# Patient Record
Sex: Female | Born: 1955 | Race: White | Hispanic: No | Marital: Single | Smoking: Never smoker
Health system: Southern US, Community
[De-identification: ages and names within clinical notes are randomized; demographics above are authoritative.]

## PROBLEM LIST (undated history)

## (undated) DIAGNOSIS — W891XXA Exposure to tanning bed, initial encounter: Secondary | ICD-10-CM

## (undated) DIAGNOSIS — I10 Essential (primary) hypertension: Secondary | ICD-10-CM

## (undated) HISTORY — PX: ABDOMINAL HYSTERECTOMY: SHX81

## (undated) HISTORY — DX: Exposure to tanning bed, initial encounter: W89.1XXA

---

## 2005-03-31 ENCOUNTER — Ambulatory Visit: Payer: Self-pay | Admitting: Obstetrics and Gynecology

## 2006-04-06 ENCOUNTER — Ambulatory Visit: Payer: Self-pay | Admitting: Obstetrics and Gynecology

## 2007-04-10 ENCOUNTER — Ambulatory Visit: Payer: Self-pay | Admitting: Obstetrics and Gynecology

## 2008-04-23 ENCOUNTER — Ambulatory Visit: Payer: Self-pay | Admitting: Obstetrics and Gynecology

## 2009-05-07 ENCOUNTER — Ambulatory Visit: Payer: Self-pay | Admitting: Obstetrics and Gynecology

## 2010-05-13 ENCOUNTER — Ambulatory Visit: Payer: Self-pay | Admitting: Obstetrics and Gynecology

## 2011-05-19 ENCOUNTER — Ambulatory Visit: Payer: Self-pay | Admitting: Obstetrics and Gynecology

## 2012-06-14 ENCOUNTER — Ambulatory Visit: Payer: Self-pay | Admitting: Obstetrics and Gynecology

## 2014-06-06 ENCOUNTER — Ambulatory Visit: Payer: Self-pay | Admitting: Unknown Physician Specialty

## 2014-08-14 ENCOUNTER — Other Ambulatory Visit: Payer: Self-pay | Admitting: Obstetrics and Gynecology

## 2014-08-14 DIAGNOSIS — Z1231 Encounter for screening mammogram for malignant neoplasm of breast: Secondary | ICD-10-CM

## 2014-08-28 ENCOUNTER — Ambulatory Visit
Admission: RE | Admit: 2014-08-28 | Discharge: 2014-08-28 | Disposition: A | Discharge status: Home / Self Care | Payer: BC Managed Care – PPO | Source: Ambulatory Visit | Attending: Obstetrics and Gynecology | Admitting: Obstetrics and Gynecology

## 2014-08-28 DIAGNOSIS — Z1231 Encounter for screening mammogram for malignant neoplasm of breast: Secondary | ICD-10-CM | POA: Diagnosis not present

## 2015-03-20 ENCOUNTER — Other Ambulatory Visit: Payer: Self-pay | Admitting: Obstetrics and Gynecology

## 2015-03-20 DIAGNOSIS — R600 Localized edema: Secondary | ICD-10-CM

## 2015-03-20 DIAGNOSIS — Z1231 Encounter for screening mammogram for malignant neoplasm of breast: Secondary | ICD-10-CM

## 2015-04-21 ENCOUNTER — Ambulatory Visit: Payer: BC Managed Care – PPO

## 2015-04-22 ENCOUNTER — Ambulatory Visit
Admission: RE | Admit: 2015-04-22 | Discharge: 2015-04-22 | Disposition: A | Discharge status: Home / Self Care | Payer: BC Managed Care – PPO | Source: Ambulatory Visit | Attending: Obstetrics and Gynecology | Admitting: Obstetrics and Gynecology

## 2015-04-22 DIAGNOSIS — R600 Localized edema: Secondary | ICD-10-CM | POA: Diagnosis not present

## 2015-04-22 DIAGNOSIS — M79604 Pain in right leg: Secondary | ICD-10-CM | POA: Diagnosis present

## 2015-04-22 DIAGNOSIS — M79605 Pain in left leg: Secondary | ICD-10-CM | POA: Diagnosis present

## 2015-08-29 ENCOUNTER — Ambulatory Visit: Payer: BC Managed Care – PPO

## 2015-10-08 ENCOUNTER — Other Ambulatory Visit: Payer: Self-pay | Admitting: Obstetrics and Gynecology

## 2015-10-08 ENCOUNTER — Ambulatory Visit
Admission: RE | Admit: 2015-10-08 | Discharge: 2015-10-08 | Disposition: A | Discharge status: Home / Self Care | Payer: BC Managed Care – PPO | Source: Ambulatory Visit | Attending: Obstetrics and Gynecology | Admitting: Obstetrics and Gynecology

## 2015-10-08 DIAGNOSIS — Z1231 Encounter for screening mammogram for malignant neoplasm of breast: Secondary | ICD-10-CM

## 2016-09-26 IMAGING — US US EXTREM LOW VENOUS BILAT
1 series · 13 of 24 positions shown · non-contrast
Comparison: None.

CLINICAL DATA: Bilateral lower extremity edema.



[Series 1: us extrem low venous bilat · 0.08mm/px · 13 of 58 slices shown]
[im 1/58]
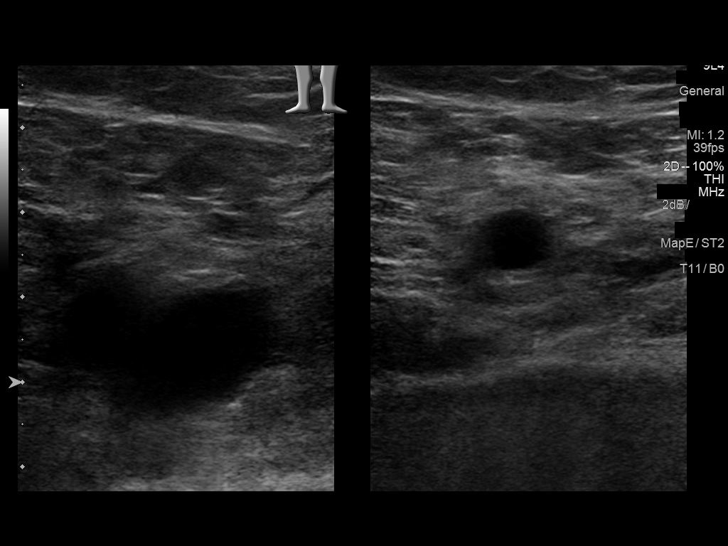
[im 5/58]
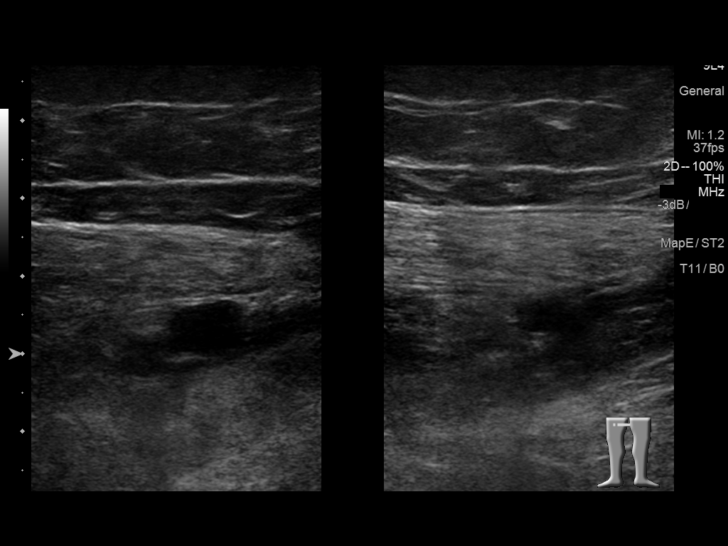
[im 10/58]
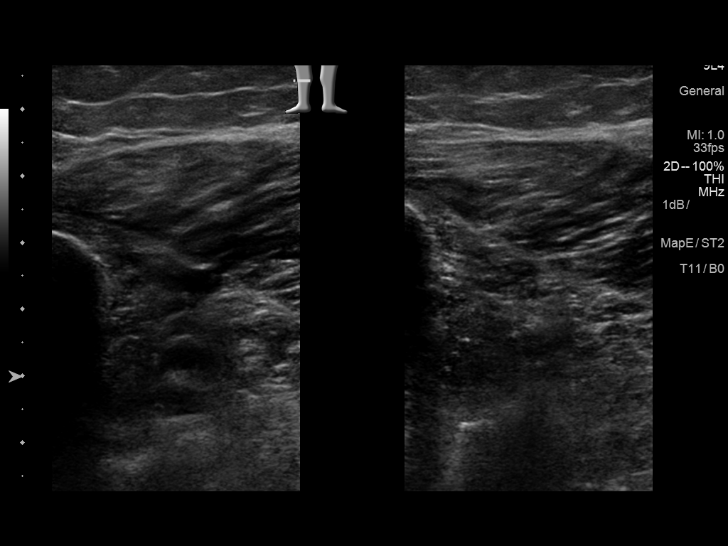
[im 15/58]
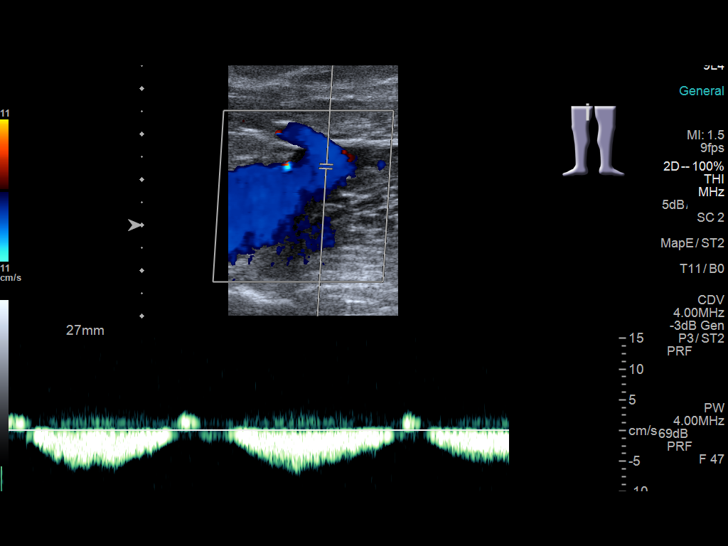
[im 20/58]
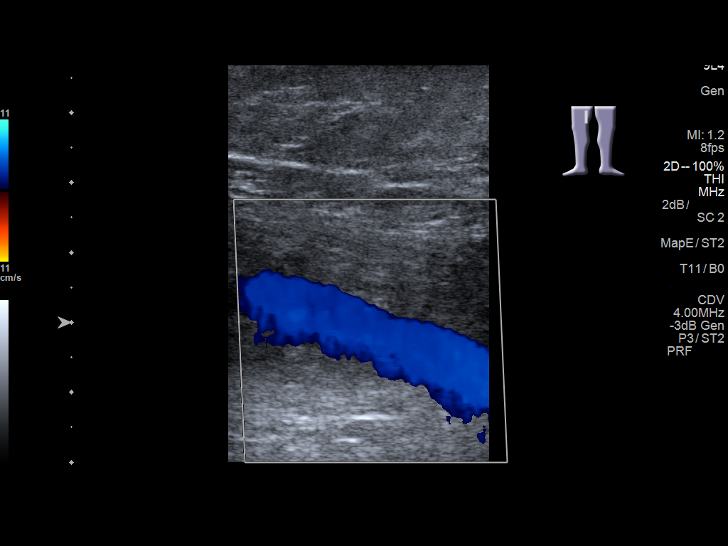
[im 25/58]
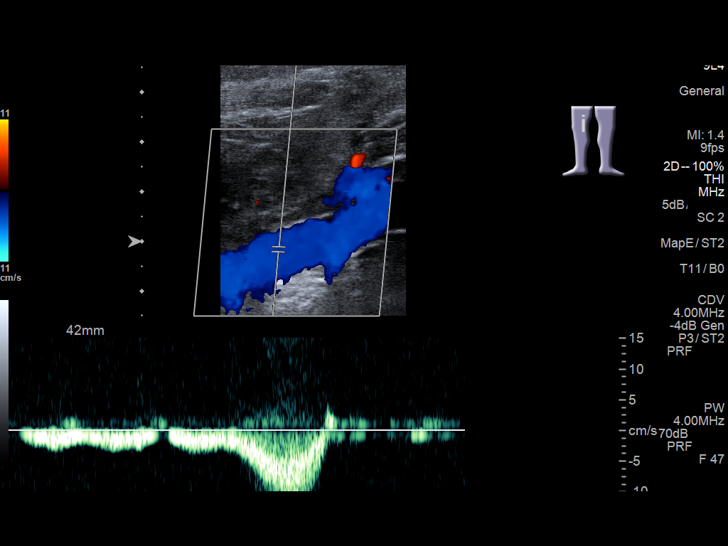
[im 30/58]
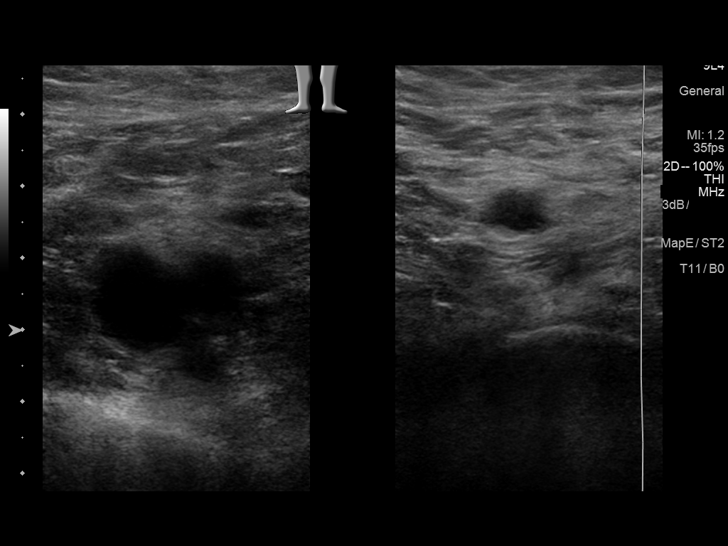
[im 33/58]
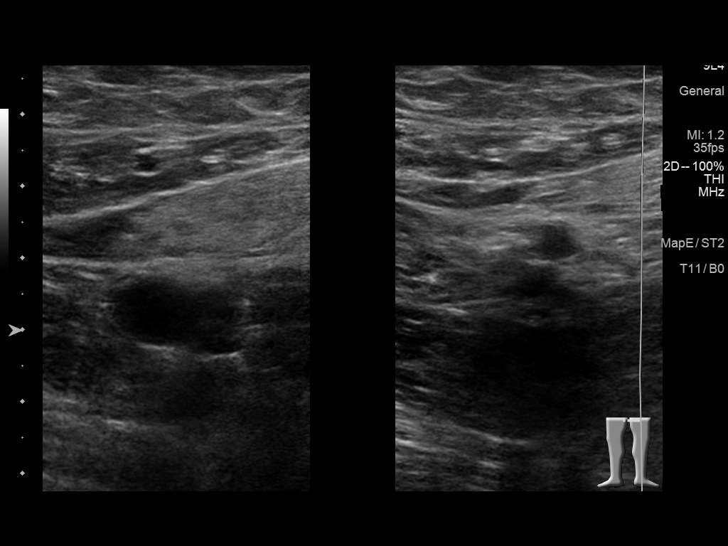
[im 38/58]
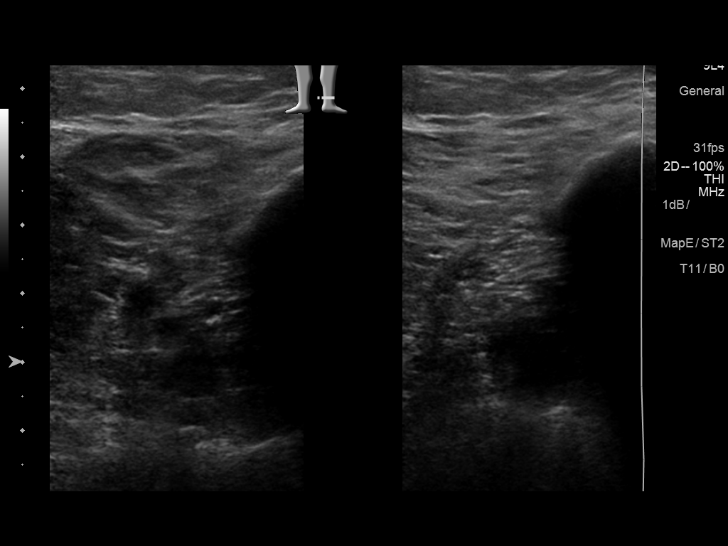
[im 43/58]
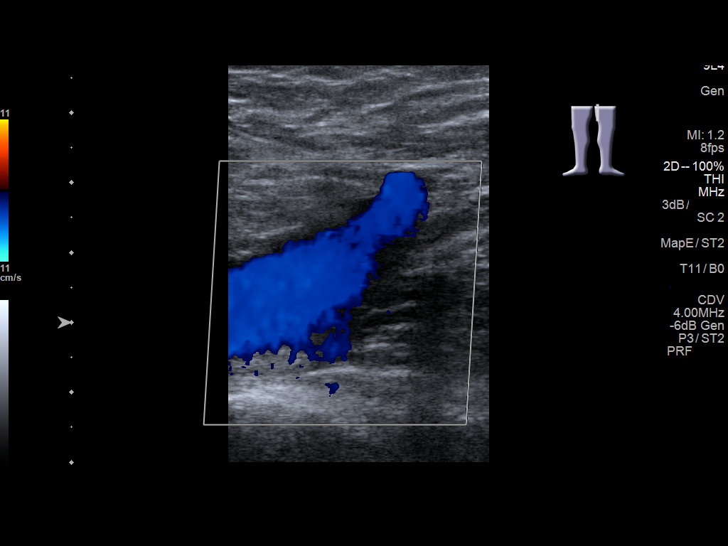
[im 48/58]
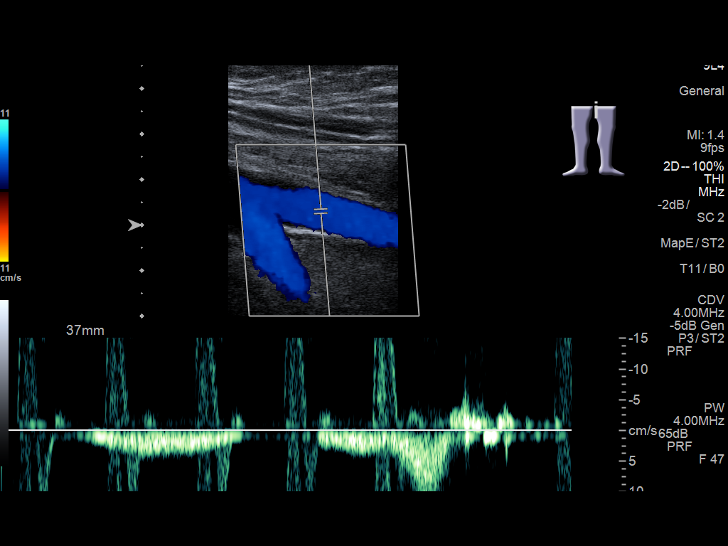
[im 53/58]
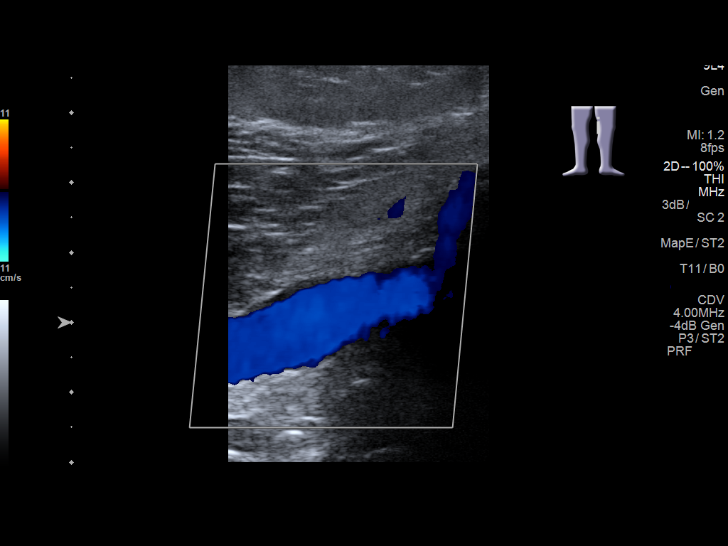
[im 58/58]
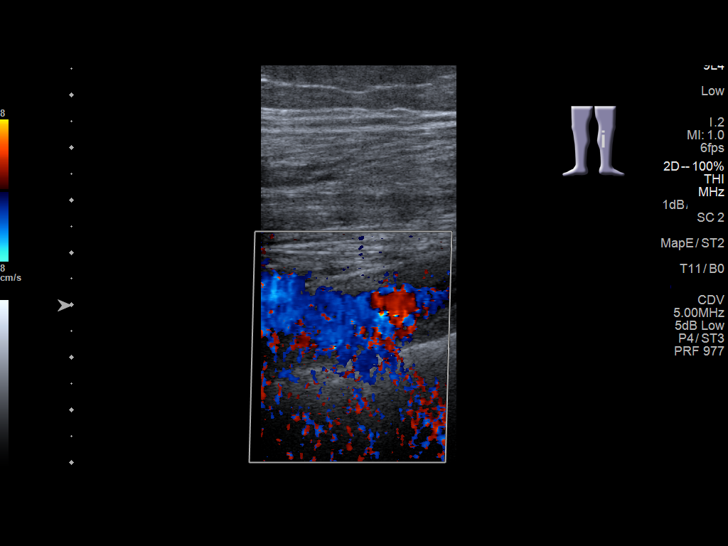

[13 of 24 positions shown; findings below may reference images not displayed]

FINDINGS: RIGHT LOWER EXTREMITY

Common Femoral Vein: No evidence of thrombus. Normal
compressibility, respiratory phasicity and response to augmentation.

Saphenofemoral Junction: No evidence of thrombus. Normal
compressibility and flow on color Doppler imaging.

Profunda Femoral Vein: No evidence of thrombus. Normal
compressibility and flow on color Doppler imaging.

Femoral Vein: No evidence of thrombus. Normal compressibility,
respiratory phasicity and response to augmentation.

Popliteal Vein: No evidence of thrombus. Normal compressibility,
respiratory phasicity and response to augmentation.

Calf Veins: No evidence of thrombus. Normal compressibility and flow
on color Doppler imaging.

Superficial Great Saphenous Vein: No evidence of thrombus. Normal
compressibility and flow on color Doppler imaging.

Other Findings:  None.

LEFT LOWER EXTREMITY

Common Femoral Vein: No evidence of thrombus. Normal
compressibility, respiratory phasicity and response to augmentation.

Saphenofemoral Junction: No evidence of thrombus. Normal
compressibility and flow on color Doppler imaging.

Profunda Femoral Vein: No evidence of thrombus. Normal
compressibility and flow on color Doppler imaging.

Femoral Vein: No evidence of thrombus. Normal compressibility,
respiratory phasicity and response to augmentation.

Popliteal Vein: No evidence of thrombus. Normal compressibility,
respiratory phasicity and response to augmentation.

Calf Veins: No evidence of thrombus. Normal compressibility and flow
on color Doppler imaging.

Superficial Great Saphenous Vein: No evidence of thrombus. Normal
compressibility and flow on color Doppler imaging.

Other Findings:  None.
IMPRESSION: No evidence of deep venous thrombosis.

## 2016-11-25 ENCOUNTER — Ambulatory Visit: Payer: Self-pay | Admitting: Family Medicine

## 2016-12-06 ENCOUNTER — Encounter: Payer: Self-pay | Admitting: Family Medicine

## 2016-12-06 ENCOUNTER — Ambulatory Visit (INDEPENDENT_AMBULATORY_CARE_PROVIDER_SITE_OTHER): Payer: BC Managed Care – PPO | Admitting: Family Medicine

## 2016-12-06 VITALS — BP 128/84 | HR 77 | Resp 16 | Ht 63.5 in | Wt 191.6 lb

## 2016-12-06 DIAGNOSIS — K5901 Slow transit constipation: Secondary | ICD-10-CM | POA: Diagnosis not present

## 2016-12-06 DIAGNOSIS — R5383 Other fatigue: Secondary | ICD-10-CM | POA: Diagnosis not present

## 2016-12-06 DIAGNOSIS — Z0000 Encounter for general adult medical examination without abnormal findings: Secondary | ICD-10-CM | POA: Diagnosis not present

## 2016-12-06 DIAGNOSIS — H6120 Impacted cerumen, unspecified ear: Secondary | ICD-10-CM | POA: Insufficient documentation

## 2016-12-06 DIAGNOSIS — Z23 Encounter for immunization: Secondary | ICD-10-CM

## 2016-12-06 DIAGNOSIS — H6122 Impacted cerumen, left ear: Secondary | ICD-10-CM

## 2016-12-06 DIAGNOSIS — E669 Obesity, unspecified: Secondary | ICD-10-CM | POA: Diagnosis not present

## 2016-12-06 DIAGNOSIS — G44229 Chronic tension-type headache, not intractable: Secondary | ICD-10-CM | POA: Diagnosis not present

## 2016-12-06 DIAGNOSIS — Z7189 Other specified counseling: Secondary | ICD-10-CM

## 2016-12-06 DIAGNOSIS — R609 Edema, unspecified: Secondary | ICD-10-CM | POA: Diagnosis not present

## 2016-12-06 NOTE — Progress Notes (Signed)
Date:  12/06/2016   Name:  Mikayla Carter   DOB:  1956/01/07   MRN:  [id number]  PCP:  Adline Potter, MD    Chief Complaint: Establish Care   History of Present Illness:  This is a 61 y.o. female seen for initial visit. C/o wax buildup B ears, gets flushed periodically. On ERT per GYN since TAH/BSO 2001, has thought about tapering off but has not tried. On Flexeril qhs for chronic tension headaches, has taken prn in past. On Maxide for peripheral edema x yrs, well controlled, BP has always been good. Weight stable, not exercising, admits to stress eating. 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 C/o constipation, not taking anything for it. On no vits/supps but son has suggested B12. C/o fatigue she associates with mother's illness. Father died 35's Etoh/stomach issues, mother has pancreatic/lung ca, on home hospice, sister with Crohn's, brother with RA. Tet status unknown, declines flu imm, mammo last year ok, colonoscopy 2016 ok.  Review of Systems:  Review of Systems  Constitutional: Negative for chills and fever.  HENT: Negative for ear pain and trouble swallowing.   Eyes: Negative for pain.  Respiratory: Negative for cough and shortness of breath.   Cardiovascular: Negative for chest pain and leg swelling.  Gastrointestinal: Negative for abdominal pain.  Endocrine: Negative for polydipsia and polyuria.  Genitourinary: Negative for difficulty urinating.  Musculoskeletal: Negative for joint swelling.  Neurological: Negative for syncope and light-headedness.  Hematological: Negative for adenopathy.    Patient Active Problem List   Diagnosis Date Noted  . Obesity (BMI 30.0-34.9) 12/06/2016  . Tension headache, chronic 12/06/2016  . Counseling for estrogen replacement therapy 12/06/2016  . Fatigue 12/06/2016  . Constipation by delayed colonic transit 12/06/2016  . Cerumen impaction 12/06/2016    Prior to Admission medications   Medication Sig  Start Date End Date Taking? Authorizing Provider  cyclobenzaprine (FLEXERIL) 10 MG tablet TAKE 1 TABLET BY MOUTH EVERY DAY 12/01/16  Yes [provider]  estrogen-methylTESTOSTERone 0.625-1.25 MG tablet TAKE 1 TABLET BY MOUTH EVERY DAY 03/24/16  Yes [provider]  triamterene-hydrochlorothiazide (MAXZIDE-25) 37.5-25 MG tablet Take by mouth. 10/05/16  Yes [provider]    No Known Allergies  Past Surgical History:  Procedure Laterality Date  . ABDOMINAL HYSTERECTOMY      Social History  Substance Use Topics  . Smoking status: Never Smoker  . Smokeless tobacco: Never Used  . Alcohol use No    Family History  Problem Relation Age of Onset  . Breast cancer Cousin   . Cancer Mother        liver, pancreatic     Medication list has been reviewed and updated.  Physical Examination: BP 128/84   Pulse 77   Resp 16   Ht 5' 3.5" (1.613 m)   Wt 191 lb 9.6 oz (86.9 kg)   BMI 33.41 kg/m   Physical Exam  Constitutional: She is oriented to person, place, and time. She appears well-developed and well-nourished.  HENT:  Head: Normocephalic and atraumatic.  Right Ear: External ear normal.  Left Ear: External ear normal.  Nose: Nose normal.  Mouth/Throat: Oropharynx is clear and moist.  Eyes: Pupils are equal, round, and reactive to light. Conjunctivae are normal.  L TM obscured by cerumen, R TM with mild cerumen  Neck: Neck supple. No thyromegaly present.  Cardiovascular: Normal rate, regular rhythm and normal heart sounds.   Pulmonary/Chest: Effort normal and breath sounds normal.  Abdominal: Soft. She exhibits no distension and no mass. There is no tenderness.  Musculoskeletal: She exhibits no edema.  Lymphadenopathy:    She has no cervical adenopathy.  Neurological: She is alert and oriented to person, place, and time. Coordination normal.  Romberg neg, gait normal  Skin: Skin is warm and dry.  Psychiatric: She has a normal mood and affect. Her  behavior is normal.  Nursing note and vitals reviewed.   Assessment and Plan:  1. Peripheral edema Well controlled on Maxide - Comprehensive Metabolic Panel (CMET)  2. Chronic tension-type headache, not intractable Well controlled on Flexeril qhs, discussed concerns with long term use, recommend taper when stable  3. Impacted cerumen of left ear B ears flushed to clear by CMA  4. Fatigue, unspecified type Likely due to caregiver stress, mother on hospice - CBC - TSH - B12  5. Constipation by delayed colonic transit Recommend Metamucil or Citrucel daily  6. Obesity (BMI 30.0-34.9) Exercise 30 mins/d and weight loss discussed - Lipid Profile  7. Counseling for estrogen replacement therapy Per GYN, long terms risks discussed, consider taper  8. Need for tetanus, diphtheria, and acellular pertussis (Tdap) vaccine - Tdap vaccine greater than or equal to 7yo IM  9. Healthcare maintenance - HIV antibody (with reflex) - Hepatitis C Antibody - Vitamin D (25 hydroxy) Mammogram 2019, colonoscopy 2026  Return in about 4 weeks (around 01/03/2017).   45 mins spent with pt over half in counseling  Satira Anis. Burt Ek MD [facility]  12/06/2016\

## 2016-12-07 ENCOUNTER — Encounter: Payer: Self-pay | Admitting: Family Medicine

## 2016-12-07 ENCOUNTER — Other Ambulatory Visit: Payer: Self-pay | Admitting: Family Medicine

## 2016-12-07 DIAGNOSIS — E559 Vitamin D deficiency, unspecified: Secondary | ICD-10-CM | POA: Insufficient documentation

## 2016-12-07 LAB — COMPREHENSIVE METABOLIC PANEL
ALBUMIN: 4.8 g/dL (ref 3.6–4.8)
ALT: 20 IU/L (ref 0–32)
AST: 21 IU/L (ref 0–40)
Albumin/Globulin Ratio: 1.9 (ref 1.2–2.2)
Alkaline Phosphatase: 57 IU/L (ref 39–117)
BILIRUBIN TOTAL: 0.3 mg/dL (ref 0.0–1.2)
BUN / CREAT RATIO: 24 (ref 12–28)
BUN: 20 mg/dL (ref 8–27)
CALCIUM: 9.8 mg/dL (ref 8.7–10.3)
CHLORIDE: 99 mmol/L (ref 96–106)
CO2: 24 mmol/L (ref 20–29)
CREATININE: 0.84 mg/dL (ref 0.57–1.00)
GFR, EST AFRICAN AMERICAN: 87 mL/min/{1.73_m2} (ref >59)
GFR, EST NON AFRICAN AMERICAN: 75 mL/min/{1.73_m2} (ref >59)
GLUCOSE: 89 mg/dL (ref 65–99)
Globulin, Total: 2.5 g/dL (ref 1.5–4.5)
Potassium: 3.6 mmol/L (ref 3.5–5.2)
Sodium: 139 mmol/L (ref 134–144)
TOTAL PROTEIN: 7.3 g/dL (ref 6.0–8.5)

## 2016-12-07 LAB — LIPID PANEL
CHOL/HDL RATIO: 2.8 ratio (ref 0.0–4.4)
Cholesterol, Total: 205 mg/dL — ABNORMAL HIGH (ref 100–199)
HDL: 73 mg/dL (ref >39)
LDL CALC: 115 mg/dL — AB (ref 0–99)
TRIGLYCERIDES: 87 mg/dL (ref 0–149)
VLDL Cholesterol Cal: 17 mg/dL (ref 5–40)

## 2016-12-07 LAB — CBC
HEMATOCRIT: 39 % (ref 34.0–46.6)
HEMOGLOBIN: 13 g/dL (ref 11.1–15.9)
MCH: 30.4 pg (ref 26.6–33.0)
MCHC: 33.3 g/dL (ref 31.5–35.7)
MCV: 91 fL (ref 79–97)
Platelets: 284 10*3/uL (ref 150–379)
RBC: 4.27 x10E6/uL (ref 3.77–5.28)
RDW: 13.9 % (ref 12.3–15.4)
WBC: 5.8 10*3/uL (ref 3.4–10.8)

## 2016-12-07 LAB — VITAMIN B12: Vitamin B-12: 1329 pg/mL — ABNORMAL HIGH (ref 232–1245)

## 2016-12-07 LAB — TSH: TSH: 2.1 u[IU]/mL (ref 0.450–4.500)

## 2016-12-07 LAB — HIV ANTIBODY (ROUTINE TESTING W REFLEX): HIV Screen 4th Generation wRfx: NONREACTIVE

## 2016-12-07 LAB — HEPATITIS C ANTIBODY: Hep C Virus Ab: <0.1 s/co ratio (ref 0.0–0.9)

## 2016-12-07 LAB — VITAMIN D 25 HYDROXY (VIT D DEFICIENCY, FRACTURES): VIT D 25 HYDROXY: 26.8 ng/mL — AB (ref 30.0–100.0)

## 2016-12-07 MED ORDER — VITAMIN D3 25 MCG (1000 UT) PO CAPS: 1.0000 | ORAL_CAPSULE | Freq: Every day | ORAL | Status: DC

## 2016-12-30 ENCOUNTER — Ambulatory Visit: Payer: Self-pay | Admitting: Family Medicine

## 2017-01-03 ENCOUNTER — Ambulatory Visit (INDEPENDENT_AMBULATORY_CARE_PROVIDER_SITE_OTHER): Payer: BC Managed Care – PPO | Admitting: Family Medicine

## 2017-01-03 ENCOUNTER — Encounter: Payer: Self-pay | Admitting: Family Medicine

## 2017-01-03 VITALS — BP 124/84 | HR 54 | Resp 16 | Ht 63.6 in | Wt 190.0 lb

## 2017-01-03 DIAGNOSIS — Z7189 Other specified counseling: Secondary | ICD-10-CM

## 2017-01-03 DIAGNOSIS — R600 Localized edema: Secondary | ICD-10-CM | POA: Diagnosis not present

## 2017-01-03 DIAGNOSIS — R001 Bradycardia, unspecified: Secondary | ICD-10-CM | POA: Diagnosis not present

## 2017-01-03 DIAGNOSIS — K5901 Slow transit constipation: Secondary | ICD-10-CM

## 2017-01-03 DIAGNOSIS — G44229 Chronic tension-type headache, not intractable: Secondary | ICD-10-CM

## 2017-01-03 DIAGNOSIS — E669 Obesity, unspecified: Secondary | ICD-10-CM

## 2017-01-03 DIAGNOSIS — E559 Vitamin D deficiency, unspecified: Secondary | ICD-10-CM | POA: Diagnosis not present

## 2017-01-03 NOTE — Progress Notes (Signed)
Date:  01/03/2017   Name:  Mikayla Carter   DOB:  October 14, 1955   MRN:  [id number]  PCP:  Mikayla Potter, MD    Chief Complaint: Edema (follow up )   History of Present Illness:  This is a 61 y.o. female seen for one month f/u from initial visit. Edema well controlled on Maxide. Still taking Flexeril qhs for tension headaches, has not tried to taper. On HRT per Gyn. Constipation not an issue lately. Prefers to avoid vitamin supplements.   Review of Systems:  Review of Systems  Constitutional: Negative for chills and fever.  Respiratory: Negative for cough and shortness of breath.   Cardiovascular: Negative for chest pain and leg swelling.  Genitourinary: Negative for difficulty urinating.  Neurological: Negative for syncope and light-headedness.    Patient Active Problem List   Diagnosis Date Noted  . Bilateral lower extremity edema 01/03/2017  . Vitamin D insufficiency 12/07/2016  . Obesity (BMI 30.0-34.9) 12/06/2016  . Tension headache, chronic 12/06/2016  . Counseling for estrogen replacement therapy 12/06/2016  . Fatigue 12/06/2016  . Constipation by delayed colonic transit 12/06/2016  . Cerumen impaction 12/06/2016    Prior to Admission medications   Medication Sig Start Date End Date Taking? Authorizing Provider  cyclobenzaprine (FLEXERIL) 10 MG tablet Take 1 tablet by mouth at bedtime. 12/01/16  Yes [provider]  estrogen-methylTESTOSTERone 0.625-1.25 MG tablet TAKE 1 TABLET BY MOUTH EVERY DAY 03/24/16  Yes [provider]  triamterene-hydrochlorothiazide (MAXZIDE-25) 37.5-25 MG tablet Take 1 tablet by mouth daily. 10/05/16  Yes [provider]    No Known Allergies  Past Surgical History:  Procedure Laterality Date  . ABDOMINAL HYSTERECTOMY      Social History  Substance Use Topics  . Smoking status: Never Smoker  . Smokeless tobacco: Never Used  . Alcohol use No    Family History  Problem Relation Age of Onset  . Breast cancer  Cousin   . Cancer Mother        liver, pancreatic     Medication list has been reviewed and updated.  Physical Examination: BP 124/84   Pulse (!) 54 Comment: manually  Resp 16   Ht 5' 3.6" (1.615 m)   Wt 190 lb (86.2 kg)   SpO2 95%   BMI 33.02 kg/m   Physical Exam  Constitutional: She appears well-developed and well-nourished.  Cardiovascular: Normal rate, regular rhythm and normal heart sounds.   Pulmonary/Chest: Effort normal and breath sounds normal.  Musculoskeletal: She exhibits no edema.  Neurological: She is alert.  Skin: Skin is warm and dry.  Psychiatric: She has a normal mood and affect. Her behavior is normal.  Nursing note and vitals reviewed.   Assessment and Plan:  1. Bilateral lower extremity edema Well controlled on Maxide  2. Bradycardia Asymptomatic, monitor  3. Constipation by delayed colonic transit Resolved  4. Chronic tension-type headache, not intractable Discussed wean off Flexeril as tolerated  5. Obesity (BMI 30.0-34.9) Weight stable, exercise/weight loss discussed  6. Vitamin D insufficiency Prefers increased sun exposure to supplement  7. Counseling for estrogen replacement therapy Risks discussed last visit, cont per Gyn  Return in about 6 months (around 07/04/2017).  [name], [facility]  01/03/2017

## 2017-04-29 ENCOUNTER — Other Ambulatory Visit: Payer: Self-pay | Admitting: Obstetrics and Gynecology

## 2017-04-29 DIAGNOSIS — Z1231 Encounter for screening mammogram for malignant neoplasm of breast: Secondary | ICD-10-CM

## 2017-05-31 ENCOUNTER — Ambulatory Visit
Admission: RE | Admit: 2017-05-31 | Discharge: 2017-05-31 | Disposition: A | Discharge status: Home / Self Care | Payer: BC Managed Care – PPO | Source: Ambulatory Visit | Attending: Obstetrics and Gynecology | Admitting: Obstetrics and Gynecology

## 2017-05-31 DIAGNOSIS — Z1231 Encounter for screening mammogram for malignant neoplasm of breast: Secondary | ICD-10-CM | POA: Diagnosis not present

## 2017-07-04 ENCOUNTER — Ambulatory Visit: Payer: BC Managed Care – PPO | Admitting: Family Medicine

## 2017-07-13 ENCOUNTER — Ambulatory Visit: Payer: BC Managed Care – PPO | Admitting: Family Medicine

## 2017-07-14 ENCOUNTER — Encounter: Payer: Self-pay | Admitting: Family Medicine

## 2017-07-14 ENCOUNTER — Ambulatory Visit (INDEPENDENT_AMBULATORY_CARE_PROVIDER_SITE_OTHER): Payer: BC Managed Care – PPO | Admitting: Family Medicine

## 2017-07-14 VITALS — BP 118/78 | HR 95 | Resp 16 | Ht 63.5 in | Wt 207.0 lb

## 2017-07-14 DIAGNOSIS — E669 Obesity, unspecified: Secondary | ICD-10-CM

## 2017-07-14 DIAGNOSIS — R600 Localized edema: Secondary | ICD-10-CM

## 2017-07-14 DIAGNOSIS — E559 Vitamin D deficiency, unspecified: Secondary | ICD-10-CM

## 2017-07-14 DIAGNOSIS — D2272 Melanocytic nevi of left lower limb, including hip: Secondary | ICD-10-CM

## 2017-07-14 DIAGNOSIS — Z7189 Other specified counseling: Secondary | ICD-10-CM | POA: Diagnosis not present

## 2017-07-14 DIAGNOSIS — G44229 Chronic tension-type headache, not intractable: Secondary | ICD-10-CM

## 2017-07-14 DIAGNOSIS — I872 Venous insufficiency (chronic) (peripheral): Secondary | ICD-10-CM | POA: Diagnosis not present

## 2017-07-14 DIAGNOSIS — R590 Localized enlarged lymph nodes: Secondary | ICD-10-CM | POA: Diagnosis not present

## 2017-07-14 NOTE — Progress Notes (Signed)
Date:  07/14/2017   Name:  Mikayla Carter   DOB:  12-18-1955   MRN:  [id number]  PCP:  Adline Potter, MD    Chief Complaint: Edema (6 mo Follow up); Neck Pain (has swelling in neck on right side and had issues 15 years ago with bug bite that they said may hang around as swollen lump with no issues. No she noticed it may have grown or moved and causes pain now and then. ); and Rash (skin irritated on legs with some red bumps not sure if razor burn or if it is something else. L and R leg rash x 2 years. )   History of Present Illness:  This is a 62 y.o. female seen for 6 month f/u. C/o R posterior cervical mass x 14 yrs, maybe getting a little larger past 3 months, nontender. Also c/o B lower leg rash past two years with discolored lesion L lower leg. Remains on Maxide for BLE edema, Flexeril qhs for chronic tension HAs (intolerant taper), HRT per Gyn. Vit D level low in past but preferred to increase sun exposure.  Review of Systems:  Review of Systems  Constitutional: Negative for chills and fever.  Respiratory: Negative for cough and shortness of breath.   Cardiovascular: Negative for chest pain.  Genitourinary: Negative for difficulty urinating.  Neurological: Negative for syncope and light-headedness.    Patient Active Problem List   Diagnosis Date Noted  . Posterior cervical adenopathy 07/14/2017  . Bilateral lower extremity edema 01/03/2017  . Bradycardia 01/03/2017  . Vitamin D insufficiency 12/07/2016  . Obesity (BMI 30.0-34.9) 12/06/2016  . Tension headache, chronic 12/06/2016  . Counseling for estrogen replacement therapy 12/06/2016  . Fatigue 12/06/2016  . Constipation by delayed colonic transit 12/06/2016  . Cerumen impaction 12/06/2016    Prior to Admission medications   Medication Sig Start Date End Date Taking? Authorizing Provider  cyclobenzaprine (FLEXERIL) 10 MG tablet Take 1 tablet by mouth at bedtime. 12/01/16  Yes [provider]   estrogen-methylTESTOSTERone 0.625-1.25 MG tablet TAKE 1 TABLET BY MOUTH EVERY DAY 03/24/16  Yes [provider]  triamterene-hydrochlorothiazide (MAXZIDE-25) 37.5-25 MG tablet Take 1 tablet by mouth daily. 10/05/16  Yes [provider]    No Known Allergies  Past Surgical History:  Procedure Laterality Date  . ABDOMINAL HYSTERECTOMY      Social History   Tobacco Use  . Smoking status: Never Smoker  . Smokeless tobacco: Never Used  Substance Use Topics  . Alcohol use: No  . Drug use: No    Family History  Problem Relation Age of Onset  . Breast cancer Cousin   . Cancer Mother        liver, pancreatic     Medication list has been reviewed and updated.  Physical Examination: BP 118/78   Pulse 95   Resp 16   Ht 5' 3.5" (1.613 m)   Wt 207 lb (93.9 kg)   SpO2 98%   BMI 36.09 kg/m   Physical Exam  Constitutional: She appears well-developed and well-nourished.  Neck:  0.5 cm smooth mobile nontender posterior cervical node  Cardiovascular: Normal rate, regular rhythm and normal heart sounds.  Pulmonary/Chest: Effort normal and breath sounds normal.  Musculoskeletal: She exhibits no edema.  Neurological: She is alert.  Skin: Skin is warm and dry.  Venous stasis changes BLE 1 x 0.5 cm irregular pigmented lesion L distal shin  Psychiatric: She has a normal mood and affect. Her behavior is normal.  Nursing note and vitals reviewed.   Assessment and Plan:  1. Atypical nevus of lower leg, left - Ambulatory referral to Dermatology  2. Chronic venous stasis dermatitis of both lower extremities Discuss with derm  3. Posterior cervical adenopathy Benign appearing, monitor for growth, tenderness  4. Chronic tension-type headache, not intractable Attempt Flexeril taper when life more stable  5. Bilateral lower extremity edema Cont Maxide, consider BMP next visit  6. Counseling for estrogen replacement therapy Risks/benefits discussed, cont per  Gyn  7. Vitamin D insufficiency Consider repeat level next visit  8. Obesity (BMI 30.0-34.9) Weight up 17#, exercise/weight loss discussed  Return in about 6 months (around 01/13/2018).  Satira Anis. [facility]  07/14/2017

## 2018-05-16 ENCOUNTER — Other Ambulatory Visit: Payer: Self-pay | Admitting: Obstetrics and Gynecology

## 2018-05-16 DIAGNOSIS — Z1231 Encounter for screening mammogram for malignant neoplasm of breast: Secondary | ICD-10-CM

## 2018-05-20 ENCOUNTER — Ambulatory Visit
Admission: EM | Admit: 2018-05-20 | Discharge: 2018-05-20 | Disposition: A | Discharge status: Home / Self Care | Payer: BC Managed Care – PPO | Attending: Emergency Medicine | Admitting: Emergency Medicine

## 2018-05-20 ENCOUNTER — Encounter: Payer: Self-pay | Admitting: Gynecology

## 2018-05-20 ENCOUNTER — Other Ambulatory Visit: Payer: Self-pay

## 2018-05-20 DIAGNOSIS — J029 Acute pharyngitis, unspecified: Secondary | ICD-10-CM

## 2018-05-20 DIAGNOSIS — R519 Headache, unspecified: Secondary | ICD-10-CM

## 2018-05-20 DIAGNOSIS — R51 Headache: Secondary | ICD-10-CM

## 2018-05-20 HISTORY — DX: Essential (primary) hypertension: I10

## 2018-05-20 LAB — RAPID STREP SCREEN (MED CTR MEBANE ONLY): Streptococcus, Group A Screen (Direct): NEGATIVE

## 2018-05-20 NOTE — ED Provider Notes (Signed)
[facility]    CSN: [id number] Arrival date & time: 05/20/18  1238     History   Chief Complaint No chief complaint on file.   HPI Mikayla Carter is a 63 y.o. female. Patient presents with 3-4 day history of intermittent headaches and moderate sore throat. She says her symptoms are similar to when she has had strep throat in the past. Denies fever, fatigue, body aches, and cough. Has had minor nasal congestion. Headaches are mild and not associated with dizziness, vision changes, numbness/tingling/weakness, nausea, speech or balance problems. Headaches resolve with BC powder. She has no other concerns today.  HPI  Past Medical History:  Diagnosis Date  . Hypertension   . Tanning bed exposure     Patient Active Problem List   Diagnosis Date Noted  . Posterior cervical adenopathy 07/14/2017  . 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 Bilateral lower extremity edema 01/03/2017  . Bradycardia 01/03/2017  . Vitamin D insufficiency 12/07/2016  . Obesity (BMI 30.0-34.9) 12/06/2016  . Tension headache, chronic 12/06/2016  . Counseling for estrogen replacement therapy 12/06/2016  . Fatigue 12/06/2016  . Constipation by delayed colonic transit 12/06/2016  . Cerumen impaction 12/06/2016    Past Surgical History:  Procedure Laterality Date  . ABDOMINAL HYSTERECTOMY      OB History   No obstetric history on file.      Home Medications    Prior to Admission medications   Medication Sig Start Date End Date Taking? Authorizing Provider  cyclobenzaprine (FLEXERIL) 10 MG tablet Take 1 tablet by mouth at bedtime. 12/01/16  Yes [provider]  triamterene-hydrochlorothiazide (MAXZIDE-25) 37.5-25 MG tablet Take 1 tablet by mouth daily. 10/05/16  Yes [provider]  estrogen-methylTESTOSTERone 0.625-1.25 MG tablet TAKE 1 TABLET BY MOUTH EVERY DAY 03/24/16   [provider]    Family History Family History    Problem Relation Age of Onset  . Breast cancer Cousin   . Cancer Mother        liver, pancreatic     Social History Social History   Tobacco Use  . Smoking status: Never Smoker  . Smokeless tobacco: Never Used  Substance Use Topics  . Alcohol use: No  . Drug use: No     Allergies   Patient has no known allergies.   Review of Systems Review of Systems  Constitutional: Negative for chills, fatigue and fever.  HENT: Positive for rhinorrhea and sore throat. Negative for congestion, ear pain, sinus pressure, sinus pain and trouble swallowing.   Eyes: Negative for photophobia and visual disturbance.  Respiratory: Negative for cough and shortness of breath.   Cardiovascular: Negative for chest pain.  Gastrointestinal: Negative for nausea and vomiting.  Musculoskeletal: Negative for arthralgias and myalgias.  Skin: Negative for rash.  Neurological: Positive for headaches. Negative for dizziness, weakness, light-headedness and numbness.  Hematological: Positive for adenopathy.     Physical Exam Triage Vital Signs ED Triage Vitals [05/20/18 1255]  Enc Vitals Group     BP (!) 152/76     Pulse Rate 60     Resp 16     Temp (!) 97.2 F (36.2 C)     Temp Source Oral     SpO2 98 %     Weight 197 lb (89.4 kg)     Height 5\' 4"  (1.626 m)     Head Circumference      Peak Flow      Pain Score 3  Pain Loc      Pain Edu?      Excl. in [place]?    No data found.  Updated Vital Signs BP (!) 152/76 (BP Location: Left Arm)   Pulse 60   Temp (!) 97.2 F (36.2 C) (Oral)   Resp 16   Ht 5\' 4"  (1.626 m)   Wt 197 lb (89.4 kg)   SpO2 98%   BMI 33.81 kg/m       Physical Exam Vitals signs and nursing note reviewed.  Constitutional:      General: She is not in acute distress.    Appearance: Normal appearance. She is normal weight. She is not ill-appearing.  HENT:     Head: Normocephalic and atraumatic.     Right Ear: Tympanic membrane, ear canal and external ear normal.      Left Ear: Tympanic membrane, ear canal and external ear normal.     Nose: Nose normal. No congestion or rhinorrhea.     Right Sinus: No maxillary sinus tenderness or frontal sinus tenderness.     Left Sinus: No maxillary sinus tenderness or frontal sinus tenderness.     Mouth/Throat:     Pharynx: Posterior oropharyngeal erythema present. No oropharyngeal exudate or uvula swelling.     Tonsils: No tonsillar exudate. Swelling: 0 on the right.  Eyes:     General: No scleral icterus.       Right eye: No discharge.        Left eye: No discharge.     Conjunctiva/sclera: Conjunctivae normal.  Neck:     Musculoskeletal: Neck supple. No muscular tenderness.  Cardiovascular:     Rate and Rhythm: Normal rate and regular rhythm.     Heart sounds: Normal heart sounds. No murmur.  Pulmonary:     Effort: Pulmonary effort is normal.     Breath sounds: Normal breath sounds. No wheezing, rhonchi or rales.  Lymphadenopathy:     Cervical: Cervical adenopathy present.  Skin:    General: Skin is warm and dry.     Findings: No rash.  Neurological:     General: No focal deficit present.     Mental Status: She is alert and oriented to person, place, and time.  Psychiatric:        Mood and Affect: Mood normal.        Behavior: Behavior normal.        Thought Content: Thought content normal.      UC Treatments / Results  Labs (all labs ordered are listed, but only abnormal results are displayed) Labs Reviewed  RAPID STREP SCREEN ([facility] ONLY)  CULTURE, GROUP A STREP [facility])    EKG None  Radiology No results found.  Procedures Procedures (including critical care time)  Medications Ordered in UC Medications - No data to display  Initial Impression / Assessment and Plan / UC Course  I have reviewed the triage vital signs and the nursing notes.  Pertinent labs & imaging results that were available during my care of the patient were reviewed by me and considered in my medical  decision making (see chart for details).    Final Clinical Impressions(s) / UC Diagnoses   Final diagnoses:  Acute pharyngitis, unspecified etiology  Acute nonintractable headache, unspecified headache type     Discharge Instructions     SORE THROAT/HEADACHE: Your rapid strep test was negative today. A culture will be done and you will be contacted if it is positive for strep. In that case  you will need antibiotics. Otherwise this could be a viral infection and sore throat from post nasal drainage. Rest, increase fluids, consider OTC Mucinex D or Sudafed as well as nasal sprays. Continue BC, Motrin or Tylenol for headache. Return or go to ER if headache becomes severe or is associated with dizziness, numbness/tingling/weakness, fatigue, etc.     ED Prescriptions    None     Controlled Substance Prescriptions [place] Controlled Substance Registry consulted? Not Applicable   Gretta Cool 05/20/18 1901

## 2018-05-20 NOTE — Discharge Instructions (Addendum)
SORE THROAT/HEADACHE: Your rapid strep test was negative today. A culture will be done and you will be contacted if it is positive for strep. In that case you will need antibiotics. Otherwise this could be a viral infection and sore throat from post nasal drainage. Rest, increase fluids, consider OTC Mucinex D or Sudafed as well as nasal sprays. Continue BC, Motrin or Tylenol for headache. Return or go to ER if headache becomes severe or is associated with dizziness, numbness/tingling/weakness, fatigue, etc.

## 2018-05-20 NOTE — ED Triage Notes (Signed)
Patient c/o x 3-4 days with scratchy throat and headace

## 2018-05-23 LAB — CULTURE, GROUP A STREP (THRC)

## 2018-07-18 ENCOUNTER — Ambulatory Visit: Payer: BC Managed Care – PPO | Admitting: Family Medicine

## 2018-08-29 ENCOUNTER — Encounter: Payer: BC Managed Care – PPO | Admitting: Family Medicine

## 2018-10-17 ENCOUNTER — Ambulatory Visit
Admission: RE | Admit: 2018-10-17 | Discharge: 2018-10-17 | Disposition: A | Discharge status: Home / Self Care | Payer: BC Managed Care – PPO | Source: Ambulatory Visit | Attending: Obstetrics and Gynecology | Admitting: Obstetrics and Gynecology

## 2018-10-17 DIAGNOSIS — Z1231 Encounter for screening mammogram for malignant neoplasm of breast: Secondary | ICD-10-CM | POA: Insufficient documentation

## 2018-11-15 ENCOUNTER — Encounter: Payer: BC Managed Care – PPO | Admitting: Family Medicine

## 2019-01-17 ENCOUNTER — Encounter: Payer: BC Managed Care – PPO | Admitting: Family Medicine

## 2019-04-03 ENCOUNTER — Ambulatory Visit: Payer: BC Managed Care – PPO | Admitting: Family Medicine

## 2019-05-22 ENCOUNTER — Other Ambulatory Visit: Payer: Self-pay | Admitting: Obstetrics and Gynecology

## 2019-05-22 DIAGNOSIS — Z1231 Encounter for screening mammogram for malignant neoplasm of breast: Secondary | ICD-10-CM

## 2019-06-04 ENCOUNTER — Ambulatory Visit: Payer: BC Managed Care – PPO | Admitting: Family Medicine

## 2019-08-07 ENCOUNTER — Ambulatory Visit: Payer: BC Managed Care – PPO | Admitting: Family Medicine

## 2019-09-27 ENCOUNTER — Ambulatory Visit: Payer: Self-pay | Admitting: Family Medicine

## 2019-10-18 ENCOUNTER — Ambulatory Visit
Admission: RE | Admit: 2019-10-18 | Discharge: 2019-10-18 | Disposition: A | Discharge status: Home / Self Care | Payer: BC Managed Care – PPO | Source: Ambulatory Visit | Attending: Obstetrics and Gynecology | Admitting: Obstetrics and Gynecology

## 2019-10-18 DIAGNOSIS — Z1231 Encounter for screening mammogram for malignant neoplasm of breast: Secondary | ICD-10-CM | POA: Diagnosis present

## 2019-11-08 ENCOUNTER — Ambulatory Visit: Payer: Self-pay | Admitting: Family Medicine

## 2020-01-15 ENCOUNTER — Ambulatory Visit: Payer: Self-pay | Admitting: Family Medicine

## 2020-05-06 ENCOUNTER — Ambulatory Visit: Payer: Self-pay | Admitting: Family Medicine

## 2020-06-17 ENCOUNTER — Other Ambulatory Visit: Payer: Self-pay | Admitting: Obstetrics and Gynecology

## 2020-06-17 DIAGNOSIS — Z1231 Encounter for screening mammogram for malignant neoplasm of breast: Secondary | ICD-10-CM

## 2020-06-26 ENCOUNTER — Ambulatory Visit: Payer: Self-pay | Admitting: Family Medicine

## 2020-08-14 ENCOUNTER — Ambulatory Visit: Payer: Self-pay | Admitting: Family Medicine

## 2020-10-22 ENCOUNTER — Other Ambulatory Visit: Payer: Self-pay

## 2020-10-22 ENCOUNTER — Ambulatory Visit
Admission: RE | Admit: 2020-10-22 | Discharge: 2020-10-22 | Disposition: A | Discharge status: Home / Self Care | Payer: Medicare PPO | Source: Ambulatory Visit | Attending: Obstetrics and Gynecology | Admitting: Obstetrics and Gynecology

## 2020-10-22 DIAGNOSIS — Z1231 Encounter for screening mammogram for malignant neoplasm of breast: Secondary | ICD-10-CM | POA: Diagnosis present

## 2020-11-27 ENCOUNTER — Ambulatory Visit: Payer: Self-pay | Admitting: Family Medicine

## 2021-02-24 ENCOUNTER — Ambulatory Visit: Payer: Self-pay | Admitting: Internal Medicine

## 2021-12-30 ENCOUNTER — Other Ambulatory Visit: Payer: Self-pay | Admitting: Internal Medicine

## 2021-12-30 DIAGNOSIS — Z1231 Encounter for screening mammogram for malignant neoplasm of breast: Secondary | ICD-10-CM

## 2022-01-28 ENCOUNTER — Ambulatory Visit
Admission: RE | Admit: 2022-01-28 | Discharge: 2022-01-28 | Disposition: A | Discharge status: Home / Self Care | Payer: Medicare PPO | Source: Ambulatory Visit | Attending: Internal Medicine | Admitting: Internal Medicine

## 2022-01-28 DIAGNOSIS — Z1231 Encounter for screening mammogram for malignant neoplasm of breast: Secondary | ICD-10-CM | POA: Diagnosis not present

## 2023-01-07 IMAGING — MG MM DIGITAL SCREENING BILAT W/ TOMO AND CAD
8 series · 8 of 24 positions shown · non-contrast
Comparison: Previous exam(s).

CLINICAL DATA: Screening.

EXAM:
DIGITAL SCREENING BILATERAL MAMMOGRAM WITH TOMOSYNTHESIS AND CAD
TECHNIQUE: Bilateral screening digital craniocaudal and mediolateral oblique
mammograms were obtained. Bilateral screening digital breast
tomosynthesis was performed. The images were evaluated with
computer-aided detection.

[L MLO synth-2D]
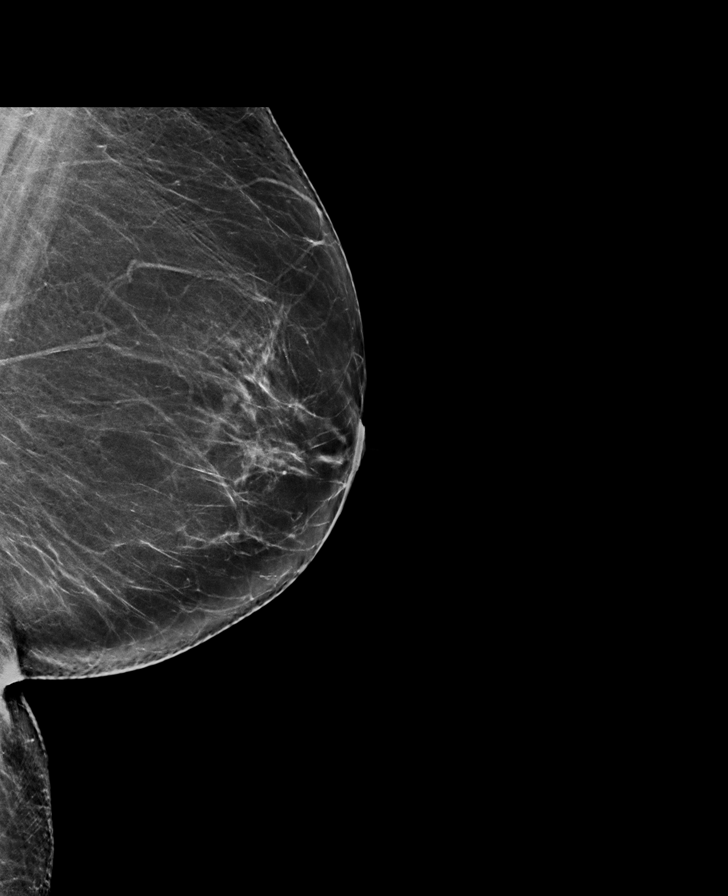

[L CC synth-2D]
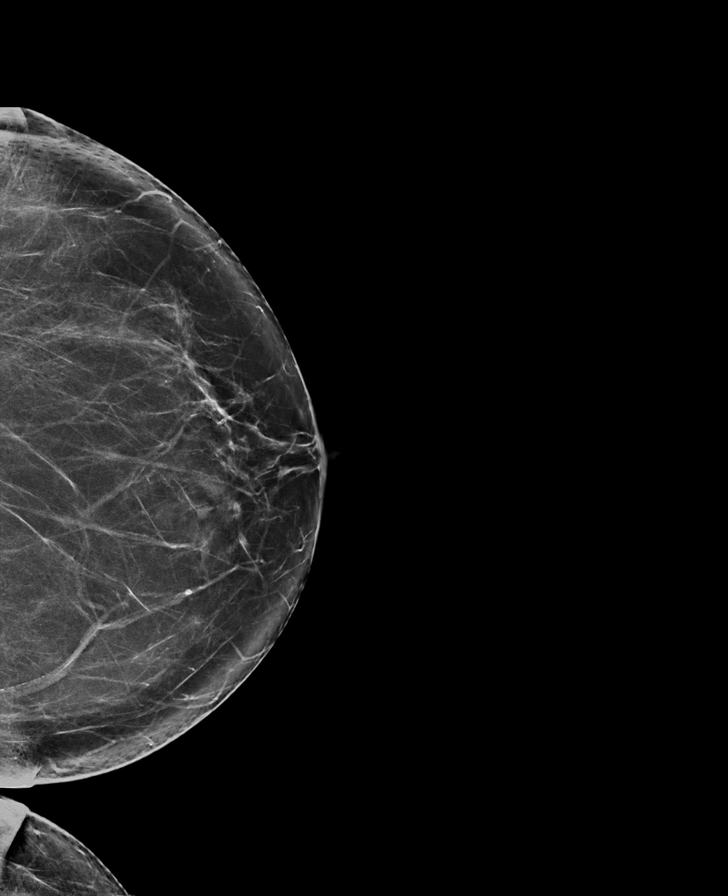

[R CC synth-2D]
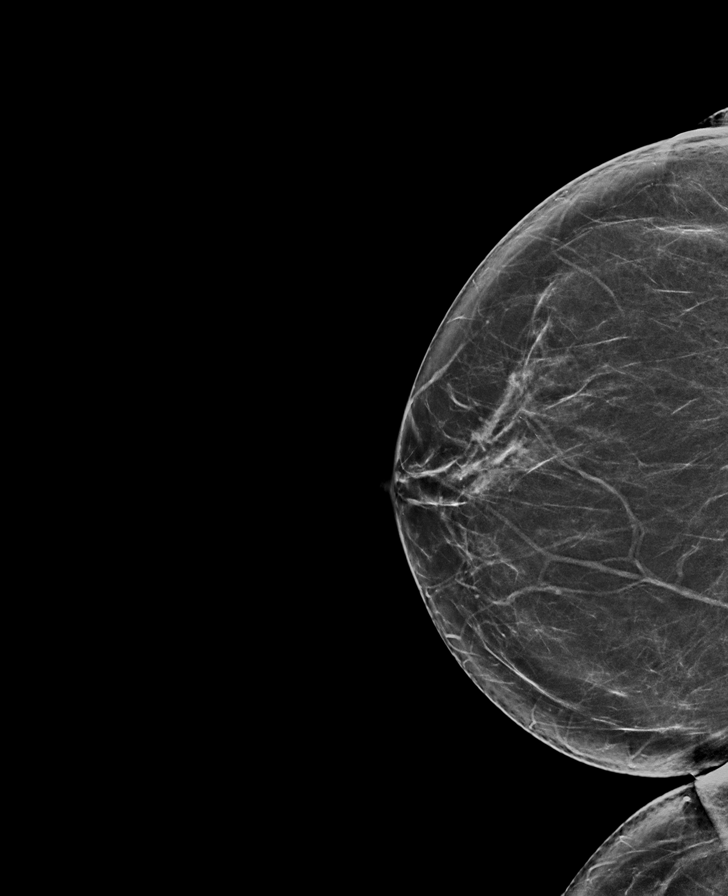

[R MLO synth-2D]
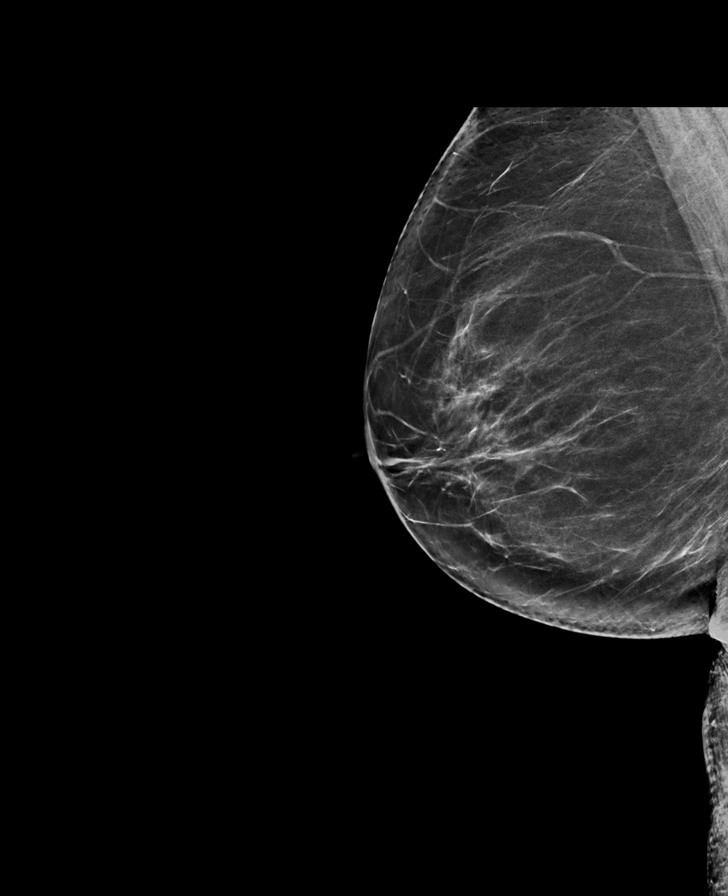

[L MLO tomo · tomo slice 41/80.0]
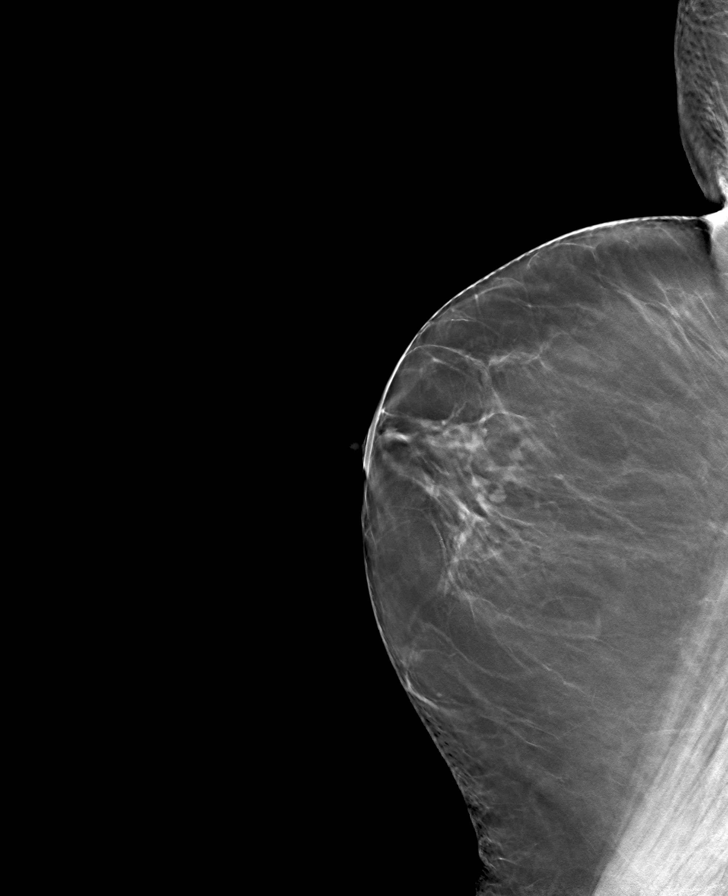

[L CC tomo · tomo slice 39/77.0]
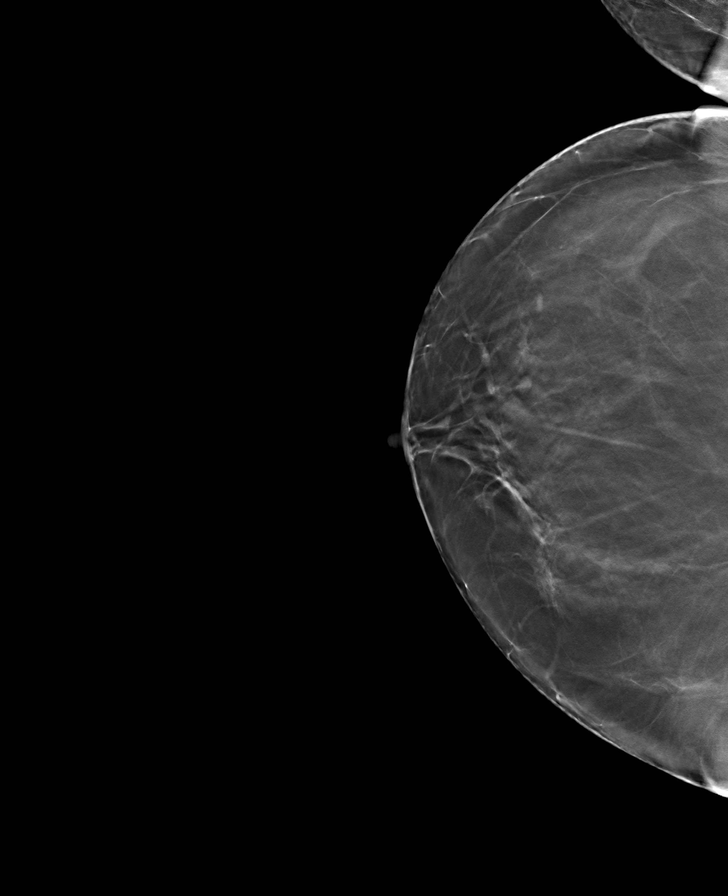

[R CC tomo · tomo slice 35/70.0]
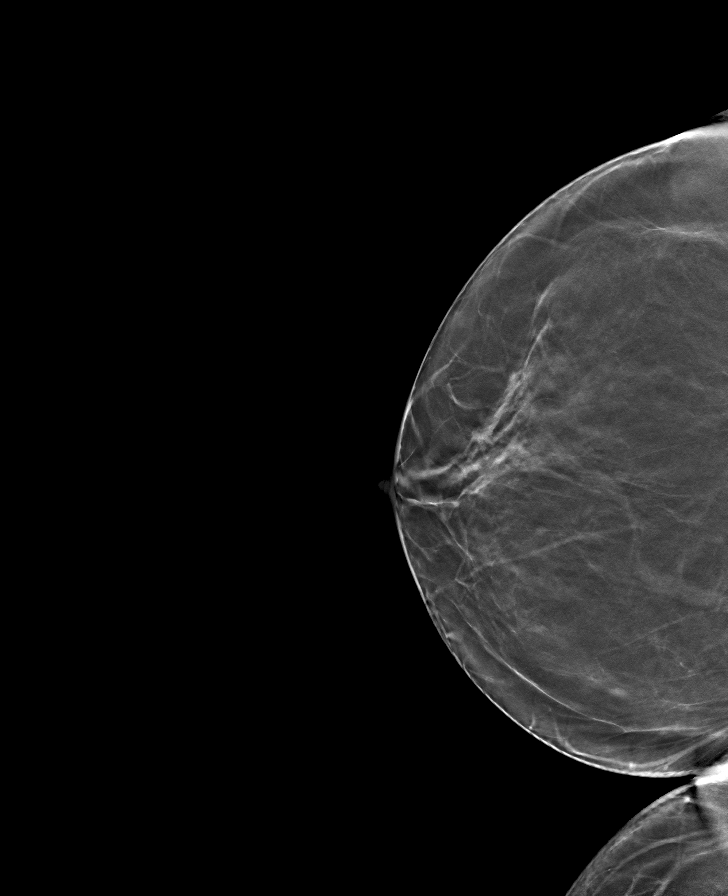

[R MLO tomo · tomo slice 39/77.0]
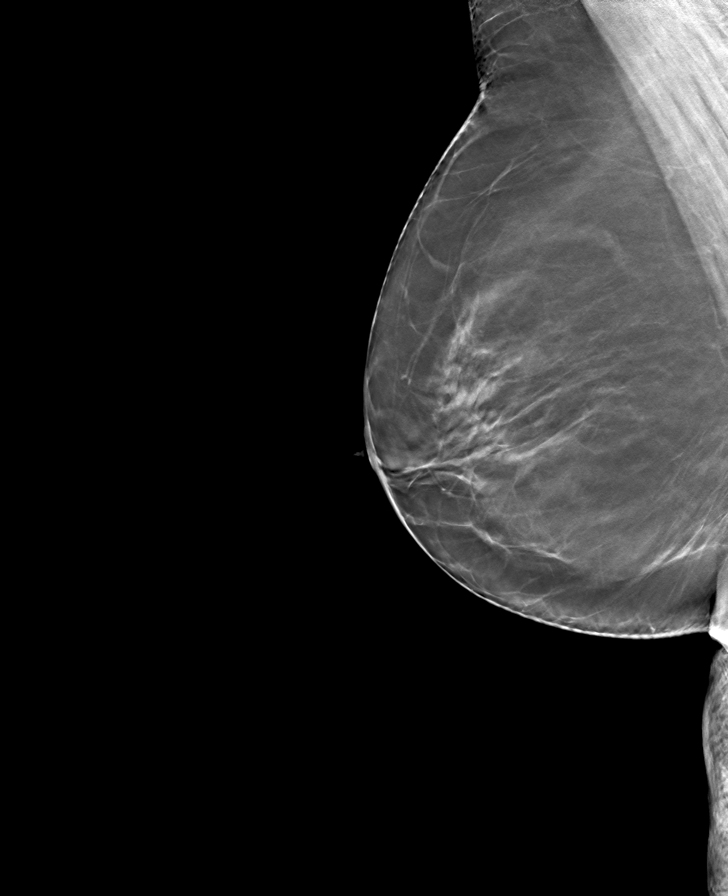

[8 of 24 positions shown; findings below may reference images not displayed]

ACR Breast Density Category b: There are scattered areas of
fibroglandular density.
FINDINGS: There are no findings suspicious for malignancy.
IMPRESSION: No mammographic evidence of malignancy. A result letter of this
screening mammogram will be mailed directly to the patient.

RECOMMENDATION:
Screening mammogram in one year. (Code:51-O-LD2)

BI-RADS CATEGORY  1: Negative.

## 2023-01-13 ENCOUNTER — Other Ambulatory Visit: Payer: Self-pay | Admitting: Internal Medicine

## 2023-01-13 DIAGNOSIS — Z1231 Encounter for screening mammogram for malignant neoplasm of breast: Secondary | ICD-10-CM

## 2023-02-15 ENCOUNTER — Ambulatory Visit
Admission: RE | Admit: 2023-02-15 | Discharge: 2023-02-15 | Disposition: A | Discharge status: Home / Self Care | Payer: Medicare PPO | Source: Ambulatory Visit | Attending: Internal Medicine | Admitting: Internal Medicine

## 2023-02-15 DIAGNOSIS — Z1231 Encounter for screening mammogram for malignant neoplasm of breast: Secondary | ICD-10-CM | POA: Diagnosis present

## 2024-01-02 ENCOUNTER — Other Ambulatory Visit: Payer: Self-pay | Admitting: Internal Medicine

## 2024-01-02 DIAGNOSIS — Z1231 Encounter for screening mammogram for malignant neoplasm of breast: Secondary | ICD-10-CM

## 2024-02-22 ENCOUNTER — Encounter

## 2024-03-14 ENCOUNTER — Inpatient Hospital Stay: Admission: RE | Admit: 2024-03-14 | Discharge: 2024-03-14 | Attending: Internal Medicine | Admitting: Internal Medicine

## 2024-03-14 DIAGNOSIS — Z1231 Encounter for screening mammogram for malignant neoplasm of breast: Secondary | ICD-10-CM | POA: Insufficient documentation
